# Patient Record
Sex: Female | Born: 1995 | Race: White | Hispanic: No | Marital: Single | Smoking: Never smoker
Health system: Southern US, Community
[De-identification: ages and names within clinical notes are randomized; demographics above are authoritative.]

## PROBLEM LIST (undated history)

## (undated) DIAGNOSIS — F329 Major depressive disorder, single episode, unspecified: Secondary | ICD-10-CM

## (undated) DIAGNOSIS — F32A Depression, unspecified: Secondary | ICD-10-CM

## (undated) DIAGNOSIS — F411 Generalized anxiety disorder: Secondary | ICD-10-CM

## (undated) DIAGNOSIS — F339 Major depressive disorder, recurrent, unspecified: Secondary | ICD-10-CM

## (undated) DIAGNOSIS — Z3041 Encounter for surveillance of contraceptive pills: Principal | ICD-10-CM

## (undated) HISTORY — DX: Major depressive disorder, recurrent, unspecified: F33.9

## (undated) HISTORY — DX: Generalized anxiety disorder: F41.1

## (undated) HISTORY — DX: Major depressive disorder, single episode, unspecified: F32.9

## (undated) HISTORY — DX: Depression, unspecified: F32.A

## (undated) HISTORY — PX: WISDOM TOOTH EXTRACTION: SHX21

## (undated) HISTORY — PX: FINGER SURGERY: SHX640

## (undated) HISTORY — DX: Encounter for surveillance of contraceptive pills: Z30.41

---
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
## 2016-06-29 LAB — HM HIV SCREENING LAB: HM HIV SCREENING: NEGATIVE

## 2017-09-20 ENCOUNTER — Other Ambulatory Visit: Payer: Self-pay

## 2017-09-20 ENCOUNTER — Encounter: Payer: Self-pay | Admitting: Family Medicine

## 2017-09-20 ENCOUNTER — Ambulatory Visit: Payer: 59 | Admitting: Family Medicine

## 2017-09-20 DIAGNOSIS — F411 Generalized anxiety disorder: Secondary | ICD-10-CM

## 2017-09-20 DIAGNOSIS — Z23 Encounter for immunization: Secondary | ICD-10-CM | POA: Diagnosis not present

## 2017-09-20 DIAGNOSIS — Z3041 Encounter for surveillance of contraceptive pills: Secondary | ICD-10-CM

## 2017-09-20 DIAGNOSIS — IMO0002 Reserved for concepts with insufficient information to code with codable children: Secondary | ICD-10-CM | POA: Insufficient documentation

## 2017-09-20 DIAGNOSIS — Z915 Personal history of self-harm: Secondary | ICD-10-CM | POA: Insufficient documentation

## 2017-09-20 DIAGNOSIS — F339 Major depressive disorder, recurrent, unspecified: Secondary | ICD-10-CM | POA: Diagnosis not present

## 2017-09-20 HISTORY — DX: Generalized anxiety disorder: F41.1

## 2017-09-20 HISTORY — DX: Major depressive disorder, recurrent, unspecified: F33.9

## 2017-09-20 HISTORY — DX: Encounter for surveillance of contraceptive pills: Z30.41

## 2017-09-20 MED ORDER — NORETHINDRONE ACET-ETHINYL EST 1-20 MG-MCG PO TABS: 1.0000 | ORAL_TABLET | Freq: Every day | ORAL | 3 refills | Status: DC

## 2017-09-20 NOTE — Progress Notes (Signed)
Subjective  CC:  Chief Complaint  Patient presents with  . Establish Care    Transfer from [place] from [place], Due for Td and Menginitis B, Unsure of Last Physical   . Contraception    wants to discuss pill options    HPI: Tara Oconnor is a 22 y.o. female who presents to [facility] at [facility] today to establish care with me as a new patient. I have reviewed old records from prior pcp  She has the following concerns or needs:  H/o GAD/Depression s/p hospitalization in 2018 for SI/depression. Since, improved; on wellbrutrin per psych in [place]; also sees counselors at school. Doing well. Last self harm was 9 months ago; cutter.   Ocps: on current pill since 2018; doing fine but expensive. H/o light periods lasting 3 days with occ cramping. Sexually active with new partner of 6 months. No vaginal d/c.   HM: due for cpe with 1st pap and std screens.   Assessment  1. Major depression, recurrent, chronic ([place])   2. GAD (generalized anxiety disorder)   3. History of self-harm   4. Oral contraceptive use      Plan   Mood d/o: well controlled on meds and with therapy  OCP: start low estrogen monophasic pill. Education given.   Return for cpe w/ pap and std screening. Discussed safe sex.  Follow up:  No follow-ups on file. Orders Placed This Encounter  Procedures  . HM HIV SCREENING LAB   Meds ordered this encounter  Medications  . norethindrone-ethinyl estradiol (LOESTRIN 1/20, 21,) 1-20 MG-MCG tablet    Sig: Take 1 tablet by mouth daily.    Dispense:  1 Package    Refill:  3     Depression screen PHQ 2/9 09/20/2017  Decreased Interest 0  Down, Depressed, Hopeless 0  PHQ - 2 Score 0  Altered sleeping 0  Tired, decreased energy 0  Change in appetite 0  Feeling bad or failure about yourself  0  Trouble concentrating 0  Moving slowly or fidgety/restless 0  Suicidal thoughts 0  PHQ-9 Score 0  Difficult doing work/chores Not  difficult at all    We updated and reviewed the patient's past history in detail and it is documented below.  Patient Active Problem List   Diagnosis Date Noted  . Major depression, recurrent, chronic ([place]) 09/20/2017  . GAD (generalized anxiety disorder) 09/20/2017  . History of self-harm 09/20/2017  . Oral contraceptive use 09/20/2017   Health Maintenance  Topic Date Due  . Samul Dada  11/22/2016  . PAP SMEAR  07/15/2017  . INFLUENZA VACCINE  10/11/2017  . HIV Screening  Completed   Immunization History  Administered Date(s) Administered  . DTaP 09/16/1996, 11/14/1996, 01/15/1997, 10/27/1997, 08/13/2001  . DTaP / Hep B / IPV 09/16/1996, 11/14/1996, 07/16/1997, 08/13/2001  . Hepatitis A 10/17/2005, 11/23/2006  . Hepatitis B 10/13/1996, 08/15/1996, 04/21/1997  . HiB (PRP-OMP) 09/16/1996, 11/14/1996, 10/27/1997  . Hpv 10/22/2008, 01/01/2009, 08/24/2009  . Influenza,inj,Quad PF,6+ Mos 12/16/2016  . MMR 07/16/1997, 08/13/2001  . Meningococcal Conjugate 10/22/2008  . Pneumococcal Conjugate-13 02/08/1999  . Td 11/23/2006  . Tdap 11/23/2006  . Varicella 07/16/1997, 10/17/2005   Current Meds  Medication Sig  . [DISCONTINUED] Norgestimate-Ethinyl Estradiol Triphasic (TRI-LO-MARZIA) 0.18/0.215/0.25 MG-25 MCG tab TAKE 1 TABLET BY MOUTH EVERY DAY    Allergies: Patient has No Known Allergies. Past Medical History Patient  has a past medical history of Depression, GAD (generalized anxiety disorder) (09/20/2017), Major depression, recurrent,  chronic ([place]) (09/20/2017), and Oral contraceptive use (09/20/2017). Past Surgical History Patient  has a past surgical history that includes Finger surgery and Wisdom tooth extraction. Family History: Patient family history includes Breast cancer in her maternal grandmother; Healthy in her father and mother; Lung cancer in her paternal grandfather. Social History:  Patient  reports that she has never smoked. She has never used smokeless tobacco.  She reports that she drinks alcohol. She reports that she has current or past drug history.  Review of Systems: Constitutional: negative for fever or malaise Ophthalmic: negative for photophobia, double vision or loss of vision Cardiovascular: negative for chest pain, dyspnea on exertion, or new LE swelling Respiratory: negative for SOB or persistent cough Gastrointestinal: negative for abdominal pain, change in bowel habits or melena Genitourinary: negative for dysuria or gross hematuria Musculoskeletal: negative for new gait disturbance or muscular weakness Integumentary: negative for new or persistent rashes Neurological: negative for TIA or stroke symptoms Psychiatric: negative for SI or delusions Allergic/Immunologic: negative for hives  Patient Care Team    Relationship Specialty Notifications Start End  Leamon Arnt, MD PCP - General Family Medicine  09/20/17     Objective  Vitals: BP 112/82   Pulse 85   Temp 98.8 F (37.1 C)   Ht 5' 6.25" (1.683 m)   Wt 202 lb 12.8 oz (92 kg)   LMP 08/21/2017   SpO2 98%   BMI 32.49 kg/m  General:  Well developed, well nourished, no acute distress  Psych:  Alert and oriented,normal mood and affect but a little anxious HEENT:  Normocephalic, atraumatic, nl conjunctiva Cardiovascular:  RRR without gallop, rub or murmur, nondisplaced PMI Respiratory:  Good breath sounds bilaterally, CTAB with normal respiratory effort Gastrointestinal: normal bowel sounds, soft, non-tender, no noted masses. No HSM Neurologic:   Normal gait   Commons side effects, risks, benefits, and alternatives for medications and treatment plan prescribed today were discussed, and the patient expressed understanding of the given instructions. Patient is instructed to call or message via MyChart if he/she has any questions or concerns regarding our treatment plan. No barriers to understanding were identified. We discussed Red Flag symptoms and signs in detail. Patient  expressed understanding regarding what to do in case of urgent or emergency type symptoms.   Medication list was reconciled, printed and provided to the patient in AVS. Patient instructions and summary information was reviewed with the patient as documented in the AVS. This note was prepared with assistance of Dragon voice recognition software. Occasional wrong-word or sound-a-like substitutions may have occurred due to the inherent limitations of voice recognition software

## 2017-09-20 NOTE — Patient Instructions (Addendum)
Please return in 1-3 months for your annual complete physical and pap smear; please come fasting.  Change to the new birth control pill on Sunday; let me know if you have any problems.   It was a pleasure meeting you today! Thank you for choosing [place] to meet your healthcare needs! I truly look forward to working with you. If you have any questions or concerns, please send me a message via Mychart or call the office at [phone].   Oral Contraception Use Oral contraceptive pills (OCPs) are medicines taken to prevent pregnancy. OCPs work by preventing the ovaries from releasing eggs. The hormones in OCPs also cause the cervical mucus to thicken, preventing the sperm from entering the uterus. The hormones also cause the uterine lining to become thin, not allowing a fertilized egg to attach to the inside of the uterus. OCPs are highly effective when taken exactly as prescribed. However, OCPs do not prevent sexually transmitted diseases (STDs). Safe sex practices, such as using condoms along with an OCP, can help prevent STDs. Before taking OCPs, you may have a physical exam and Pap test. Your health care provider may also order blood tests if necessary. Your health care provider will make sure you are a good candidate for oral contraception. Discuss with your health care provider the possible side effects of the OCP you may be prescribed. When starting an OCP, it can take 2 to 3 months for the body to adjust to the changes in hormone levels in your body. How to take oral contraceptive pills Your health care provider may advise you on how to start taking the first cycle of OCPs. Otherwise, you can:  Start on day 1 of your menstrual period. You will not need any backup contraceptive protection with this start time.  Start on the first Sunday after your menstrual period or the day you get your prescription. In these cases, you will need to use backup contraceptive protection for the first week.  Start  the pill at any time of your cycle. If you take the pill within 5 days of the start of your period, you are protected against pregnancy right away. In this case, you will not need a backup form of birth control. If you start at any other time of your menstrual cycle, you will need to use another form of birth control for 7 days. If your OCP is the type called a minipill, it will protect you from pregnancy after taking it for 2 days (48 hours).  After you have started taking OCPs:  If you forget to take 1 pill, take it as soon as you remember. Take the next pill at the regular time.  If you miss 2 or more pills, call your health care provider because different pills have different instructions for missed doses. Use backup birth control until your next menstrual period starts.  If you use a 28-day pack that contains inactive pills and you miss 1 of the last 7 pills (pills with no hormones), it will not matter. Throw away the rest of the non-hormone pills and start a new pill pack.  No matter which day you start the OCP, you will always start a new pack on that same day of the week. Have an extra pack of OCPs and a backup contraceptive method available in case you miss some pills or lose your OCP pack. Follow these instructions at home:  Do not smoke.  Always use a condom to protect against STDs. OCPs do  not protect against STDs.  Use a calendar to mark your menstrual period days.  Read the information and directions that came with your OCP. Talk to your health care provider if you have questions. Contact a health care provider if:  You develop nausea and vomiting.  You have abnormal vaginal discharge or bleeding.  You develop a rash.  You miss your menstrual period.  You are losing your hair.  You need treatment for mood swings or depression.  You get dizzy when taking the OCP.  You develop acne from taking the OCP.  You become pregnant. Get help right away if:  You develop  chest pain.  You develop shortness of breath.  You have an uncontrolled or severe headache.  You develop numbness or slurred speech.  You develop visual problems.  You develop pain, redness, and swelling in the legs. This information is not intended to replace advice given to you by your health care provider. Make sure you discuss any questions you have with your health care provider. Document Released: 02/16/2011 Document Revised: 08/05/2015 Document Reviewed: 08/18/2012 Elsevier Interactive Patient Education  2017 [employer]

## 2017-10-23 ENCOUNTER — Encounter: Payer: Self-pay | Admitting: Family Medicine

## 2017-10-23 ENCOUNTER — Other Ambulatory Visit: Payer: Self-pay

## 2017-10-23 ENCOUNTER — Other Ambulatory Visit (HOSPITAL_COMMUNITY)
Admission: RE | Admit: 2017-10-23 | Discharge: 2017-10-23 | Disposition: A | Discharge status: Home / Self Care | Payer: 59 | Source: Ambulatory Visit | Attending: Family Medicine | Admitting: Family Medicine

## 2017-10-23 ENCOUNTER — Ambulatory Visit (INDEPENDENT_AMBULATORY_CARE_PROVIDER_SITE_OTHER): Payer: 59 | Admitting: Family Medicine

## 2017-10-23 VITALS — BP 112/70 | HR 82 | Temp 98.4°F | Ht 66.5 in | Wt 201.0 lb

## 2017-10-23 DIAGNOSIS — Z124 Encounter for screening for malignant neoplasm of cervix: Secondary | ICD-10-CM

## 2017-10-23 DIAGNOSIS — Z23 Encounter for immunization: Secondary | ICD-10-CM

## 2017-10-23 DIAGNOSIS — Z3041 Encounter for surveillance of contraceptive pills: Secondary | ICD-10-CM | POA: Diagnosis not present

## 2017-10-23 DIAGNOSIS — Z01419 Encounter for gynecological examination (general) (routine) without abnormal findings: Secondary | ICD-10-CM | POA: Diagnosis not present

## 2017-10-23 DIAGNOSIS — Z0000 Encounter for general adult medical examination without abnormal findings: Secondary | ICD-10-CM

## 2017-10-23 DIAGNOSIS — Z118 Encounter for screening for other infectious and parasitic diseases: Secondary | ICD-10-CM

## 2017-10-23 LAB — COMPREHENSIVE METABOLIC PANEL
ALT: 18 U/L (ref 0–35)
AST: 14 U/L (ref 0–37)
Albumin: 3.9 g/dL (ref 3.5–5.2)
Alkaline Phosphatase: 56 U/L (ref 39–117)
BUN: 8 mg/dL (ref 6–23)
CO2: 26 meq/L (ref 19–32)
Calcium: 9.6 mg/dL (ref 8.4–10.5)
Chloride: 105 mEq/L (ref 96–112)
Creatinine, Ser: 0.71 mg/dL (ref 0.40–1.20)
GFR: 110.16 mL/min (ref >60.00)
Glucose, Bld: 85 mg/dL (ref 70–99)
POTASSIUM: 4.4 meq/L (ref 3.5–5.1)
SODIUM: 137 meq/L (ref 135–145)
Total Bilirubin: 0.5 mg/dL (ref 0.2–1.2)
Total Protein: 6.7 g/dL (ref 6.0–8.3)

## 2017-10-23 LAB — CBC WITH DIFFERENTIAL/PLATELET
BASOS ABS: 0 10*3/uL (ref 0.0–0.1)
Basophils Relative: 0.6 % (ref 0.0–3.0)
Eosinophils Absolute: 0.1 10*3/uL (ref 0.0–0.7)
Eosinophils Relative: 0.8 % (ref 0.0–5.0)
HCT: 38.3 % (ref 36.0–46.0)
Hemoglobin: 13 g/dL (ref 12.0–15.0)
LYMPHS ABS: 2.2 10*3/uL (ref 0.7–4.0)
Lymphocytes Relative: 30 % (ref 12.0–46.0)
MCHC: 33.9 g/dL (ref 30.0–36.0)
MCV: 84.4 fl (ref 78.0–100.0)
MONO ABS: 0.6 10*3/uL (ref 0.1–1.0)
MONOS PCT: 7.9 % (ref 3.0–12.0)
NEUTROS ABS: 4.4 10*3/uL (ref 1.4–7.7)
Neutrophils Relative %: 60.7 % (ref 43.0–77.0)
PLATELETS: 420 10*3/uL — AB (ref 150.0–400.0)
RBC: 4.54 Mil/uL (ref 3.87–5.11)
RDW: 13.1 % (ref 11.5–15.5)
WBC: 7.2 10*3/uL (ref 4.0–10.5)

## 2017-10-23 LAB — LIPID PANEL
CHOL/HDL RATIO: 4
Cholesterol: 192 mg/dL (ref 0–200)
HDL: 52.7 mg/dL (ref >39.00)
LDL CALC: 112 mg/dL — AB (ref 0–99)
NonHDL: 139.74
Triglycerides: 140 mg/dL (ref 0.0–149.0)
VLDL: 28 mg/dL (ref 0.0–40.0)

## 2017-10-23 MED ORDER — NORETHIN ACE-ETH ESTRAD-FE 1-20 MG-MCG PO TABS: 1.0000 | ORAL_TABLET | Freq: Every day | ORAL | 11 refills | Status: DC

## 2017-10-23 NOTE — Patient Instructions (Signed)
Please return in 12 months for your annual complete physical; please come fasting.   If you have any questions or concerns, please don't hesitate to send me a message via MyChart or call the office at [phone]. Thank you for visiting with us today! It's our pleasure caring for you.  Please do these things to maintain good health!   Exercise at least 30-45 minutes a day,  4-5 days a week.   Eat a low-fat diet with lots of fruits and vegetables, up to 7-9 servings per day.  Drink plenty of water daily. Try to drink 8 8oz glasses per day.  Seatbelts can save your life. Always wear your seatbelt.  Place Smoke Detectors on every level of your home and check batteries every year.  Schedule an appointment with an eye doctor for an eye exam every 1-2 years  Safe sex - use condoms to protect yourself from STDs if you could be exposed to these types of infections. Use birth control if you do not want to become pregnant and are sexually active.  Avoid heavy alcohol use. If you drink, keep it to less than 2 drinks/day and not every day.  Health Care Power of Attorney.  Choose someone you trust that could speak for you if you became unable to speak for yourself.  Depression is common in our stressful world.If you're feeling down or losing interest in things you normally enjoy, please come in for a visit.  If anyone is threatening or hurting you, please get help. Physical or Emotional Violence is never OK.  

## 2017-10-23 NOTE — Progress Notes (Signed)
Subjective  Chief Complaint  Patient presents with  . Annual Exam    doing well, no complaints, Patient is fasting today, Pap today     HPI: Tara Oconnor is a 22 y.o. female who presents to [place] [facility] today for a Female Wellness Visit.   Wellness Visit: annual visit with health maintenance review and exam with Pap   Doing well. Started new ocp 3 weeks ago; no side effects.   Ready to go back to college: [employer] next week. Excited.   No concerns today. See last visit a few weeks ago.   imms reviewed: due menveo and meningitis B.   Assessment  1. Annual physical exam   2. Cervical cancer screening   3. Oral contraceptive use   4. Screening for chlamydial disease   5. Need for meningitis vaccination      Plan  Female Wellness Visit:  Age appropriate Health Maintenance and Prevention measures were discussed with patient. Included topics are cancer screening recommendations, ways to keep healthy (see AVS) including dietary and exercise recommendations, regular eye and dental care, use of seat belts, and avoidance of moderate alcohol use and tobacco use.   BMI: discussed patient's BMI and encouraged positive lifestyle modifications to help get to or maintain a target BMI.  HM needs and immunizations were addressed and ordered. See below for orders. See HM and immunization section for updates. menveo and bexsero #1 today.   Routine labs and screening tests ordered including cmp, cbc and lipids where appropriate.  Discussed recommendations regarding Vit D and calcium supplementation (see AVS)  Continue ocps and STD screens ordered.   Follow up: Return in about 1 year (around 10/24/2018) for complete physical.   Orders Placed This Encounter  Procedures  . Meningococcal B, OMV  . MENINGOCOCCAL MCV4O  . CBC with Differential/Platelet  . Comprehensive metabolic panel  . Lipid panel  . HIV antibody   Meds ordered this  encounter  Medications  . norethindrone-ethinyl estradiol (JUNEL FE,GILDESS FE,LOESTRIN FE) 1-20 MG-MCG tablet    Sig: Take 1 tablet by mouth daily.    Dispense:  28 tablet    Refill:  11      Lifestyle: Body mass index is 31.96 kg/m. Wt Readings from Last 3 Encounters:  10/23/17 201 lb (91.2 kg)  09/20/17 202 lb 12.8 oz (92 kg)   Diet: general Exercise: intermittently,  Need for contraception: Yes, OCP (estrogen/progesterone)  Patient Active Problem List   Diagnosis Date Noted  . Major depression, recurrent, chronic ([place]) 09/20/2017  . GAD (generalized anxiety disorder) 09/20/2017  . History of self-harm 09/20/2017  . Oral contraceptive use 09/20/2017   Health Maintenance  Topic Date Due  . PAP SMEAR  07/15/2017  . INFLUENZA VACCINE  10/11/2017  . TETANUS/TDAP  09/21/2027  . HIV Screening  Completed   Immunization History  Administered Date(s) Administered  . DTaP 09/16/1996, 11/14/1996, 01/15/1997, 10/27/1997, 08/13/2001  . DTaP / Hep B / IPV 09/16/1996, 11/14/1996, 07/16/1997, 08/13/2001  . Hepatitis A 10/17/2005, 11/23/2006  . Hepatitis B 06/15/1996, 08/15/1996, 04/21/1997  . HiB (PRP-OMP) 09/16/1996, 11/14/1996, 10/27/1997  . Hpv 10/22/2008, 01/01/2009, 08/24/2009  . Influenza,inj,Quad PF,6+ Mos 12/16/2016  . MMR 07/16/1997, 08/13/2001  . Meningococcal B, OMV 10/23/2017  . Meningococcal Conjugate 10/22/2008  . Meningococcal Mcv4o 10/23/2017  . Pneumococcal Conjugate-13 02/08/1999  . Td 11/23/2006  . Tdap 11/23/2006, 09/20/2017  . Varicella 07/16/1997, 10/17/2005   We updated and reviewed the patient's past history in detail  and it is documented below. Allergies: Patient has No Known Allergies. Past Medical History Patient  has a past medical history of Depression, GAD (generalized anxiety disorder) (09/20/2017), Major depression, recurrent, chronic ([place]) (09/20/2017), and Oral contraceptive use (09/20/2017). Past Surgical History Patient  has a past  surgical history that includes Finger surgery and Wisdom tooth extraction. Family History: Patient family history includes Breast cancer in her maternal grandmother; Diabetes in her paternal grandmother; Healthy in her father and mother; Hyperlipidemia in her maternal grandmother; Hypertension in her maternal grandmother; Lung cancer in her paternal grandfather. Social History:  Patient  reports that she has never smoked. She has never used smokeless tobacco. She reports that she drinks alcohol. She reports that she has current or past drug history.  Review of Systems: Constitutional: negative for fever or malaise Ophthalmic: negative for photophobia, double vision or loss of vision Cardiovascular: negative for chest pain, dyspnea on exertion, or new LE swelling Respiratory: negative for SOB or persistent cough Gastrointestinal: negative for abdominal pain, change in bowel habits or melena Genitourinary: negative for dysuria or gross hematuria, no abnormal uterine bleeding or disharge Musculoskeletal: negative for new gait disturbance or muscular weakness Integumentary: negative for new or persistent rashes, no breast lumps Neurological: negative for TIA or stroke symptoms Psychiatric: negative for SI or delusions Allergic/Immunologic: negative for hives Patient Care Team    Relationship Specialty Notifications Start End  Leamon Arnt, MD PCP - General Family Medicine  09/20/17   Carolin Coy Attending Physician   09/20/17     Objective  Vitals: BP 112/70   Pulse 82   Temp 98.4 F (36.9 C)   Ht 5' 6.5" (1.689 m)   Wt 201 lb (91.2 kg)   SpO2 98%   BMI 31.96 kg/m  General:  Well developed, well nourished, no acute distress  Psych:  Alert and orientedx3,normal mood and affect HEENT:  Normocephalic, atraumatic, non-icteric sclera, PERRL, oropharynx is clear without mass or exudate, supple neck without adenopathy, mass or thyromegaly Cardiovascular:  Normal S1, S2, RRR without  gallop, rub or murmur, nondisplaced PMI Respiratory:  Good breath sounds bilaterally, CTAB with normal respiratory effort Gastrointestinal: normal bowel sounds, soft, non-tender, no noted masses. No HSM MSK: no deformities, contusions. Joints are without erythema or swelling. Spine and CVA region are nontender Skin:  Warm, no rashes or suspicious lesions noted Neurologic:    Mental status is normal. CN 2-11 are normal. Gross motor and sensory exams are normal. Normal gait. No tremor Pelvic Exam: Normal external genitalia, no vulvar or vaginal lesions present. Clear cervix w/o CMT. Bimanual exam reveals a nontender fundus w/o masses, nl size. No adnexal masses present. No inguinal adenopathy. A PAP smear was performed.    Commons side effects, risks, benefits, and alternatives for medications and treatment plan prescribed today were discussed, and the patient expressed understanding of the given instructions. Patient is instructed to call or message via MyChart if he/she has any questions or concerns regarding our treatment plan. No barriers to understanding were identified. We discussed Red Flag symptoms and signs in detail. Patient expressed understanding regarding what to do in case of urgent or emergency type symptoms.   Medication list was reconciled, printed and provided to the patient in AVS. Patient instructions and summary information was reviewed with the patient as documented in the AVS. This note was prepared with assistance of Dragon voice recognition software. Occasional wrong-word or sound-a-like substitutions may have occurred due to the inherent limitations of voice recognition software

## 2017-10-24 LAB — HIV ANTIBODY (ROUTINE TESTING W REFLEX): HIV 1&2 Ab, 4th Generation: NONREACTIVE

## 2017-10-25 LAB — CYTOLOGY - PAP
Chlamydia: NEGATIVE
NEISSERIA GONORRHEA: NEGATIVE

## 2017-12-16 DIAGNOSIS — Z23 Encounter for immunization: Secondary | ICD-10-CM | POA: Diagnosis not present

## 2017-12-17 ENCOUNTER — Encounter: Payer: Self-pay | Admitting: Family Medicine

## 2018-05-25 DIAGNOSIS — J029 Acute pharyngitis, unspecified: Secondary | ICD-10-CM | POA: Diagnosis not present

## 2018-08-18 ENCOUNTER — Encounter: Payer: Self-pay | Admitting: Family Medicine

## 2018-08-19 MED ORDER — NORETHIN ACE-ETH ESTRAD-FE 1-20 MG-MCG PO TABS: 1.0000 | ORAL_TABLET | Freq: Every day | ORAL | 1 refills | Status: DC

## 2018-08-27 ENCOUNTER — Encounter: Payer: Self-pay | Admitting: Family Medicine

## 2018-08-28 ENCOUNTER — Encounter: Payer: Self-pay | Admitting: Family Medicine

## 2018-11-12 ENCOUNTER — Encounter: Payer: Self-pay | Admitting: Family Medicine

## 2019-01-10 ENCOUNTER — Encounter: Payer: Self-pay | Admitting: Family Medicine

## 2019-01-10 ENCOUNTER — Other Ambulatory Visit: Payer: Self-pay | Admitting: Family Medicine

## 2019-01-15 ENCOUNTER — Other Ambulatory Visit (HOSPITAL_COMMUNITY)
Admission: RE | Admit: 2019-01-15 | Discharge: 2019-01-15 | Disposition: A | Discharge status: Home / Self Care | Payer: BC Managed Care – PPO | Source: Ambulatory Visit | Attending: Family Medicine | Admitting: Family Medicine

## 2019-01-15 ENCOUNTER — Other Ambulatory Visit: Payer: Self-pay

## 2019-01-15 ENCOUNTER — Ambulatory Visit (INDEPENDENT_AMBULATORY_CARE_PROVIDER_SITE_OTHER): Payer: BC Managed Care – PPO | Admitting: Family Medicine

## 2019-01-15 ENCOUNTER — Encounter: Payer: Self-pay | Admitting: Family Medicine

## 2019-01-15 VITALS — BP 126/82 | HR 104 | Temp 98.5°F | Ht 66.5 in | Wt 198.0 lb

## 2019-01-15 DIAGNOSIS — F339 Major depressive disorder, recurrent, unspecified: Secondary | ICD-10-CM

## 2019-01-15 DIAGNOSIS — R87612 Low grade squamous intraepithelial lesion on cytologic smear of cervix (LGSIL): Secondary | ICD-10-CM | POA: Insufficient documentation

## 2019-01-15 DIAGNOSIS — Z0000 Encounter for general adult medical examination without abnormal findings: Secondary | ICD-10-CM | POA: Diagnosis not present

## 2019-01-15 DIAGNOSIS — Z3041 Encounter for surveillance of contraceptive pills: Secondary | ICD-10-CM | POA: Diagnosis not present

## 2019-01-15 DIAGNOSIS — F411 Generalized anxiety disorder: Secondary | ICD-10-CM | POA: Diagnosis not present

## 2019-01-15 MED ORDER — NORETHIN ACE-ETH ESTRAD-FE 1-20 MG-MCG PO TABS: 1.0000 | ORAL_TABLET | Freq: Every day | ORAL | 3 refills | Status: DC

## 2019-01-15 NOTE — Patient Instructions (Signed)
Please return in 12 months for your annual complete physical; please come fasting.   I will release your lab results to you on your MyChart account with further instructions. Please reply with any questions.   If you have any questions or concerns, please don't hesitate to send me a message via MyChart or call the office at [phone]. Thank you for visiting with [place] today! It's our pleasure caring for you.   Preventive Care 83-22 Years Old, Female Preventive care refers to visits with your health care provider and lifestyle choices that can promote health and wellness. This includes:  A yearly physical exam. This may also be called an annual well check.  Regular dental visits and eye exams.  Immunizations.  Screening for certain conditions.  Healthy lifestyle choices, such as eating a healthy diet, getting regular exercise, not using drugs or products that contain nicotine and tobacco, and limiting alcohol use. What can I expect for my preventive care visit? Physical exam Your health care provider will check your:  Height and weight. This may be used to calculate body mass index (BMI), which tells if you are at a healthy weight.  Heart rate and blood pressure.  Skin for abnormal spots. Counseling Your health care provider may ask you questions about your:  Alcohol, tobacco, and drug use.  Emotional well-being.  Home and relationship well-being.  Sexual activity.  Eating habits.  Work and work Statistician.  Method of birth control.  Menstrual cycle.  Pregnancy history. What immunizations do I need?  Influenza (flu) vaccine  This is recommended every year. Tetanus, diphtheria, and pertussis (Tdap) vaccine  You may need a Td booster every 10 years. Varicella (chickenpox) vaccine  You may need this if you have not been vaccinated. Human papillomavirus (HPV) vaccine  If recommended by your health care provider, you may need three doses over 6 months.  Measles, mumps, and rubella (MMR) vaccine  You may need at least one dose of MMR. You may also need a second dose. Meningococcal conjugate (MenACWY) vaccine  One dose is recommended if you are age 29-21 years and a first-year college student living in a residence hall, or if you have one of several medical conditions. You may also need additional booster doses. Pneumococcal conjugate (PCV13) vaccine  You may need this if you have certain conditions and were not previously vaccinated. Pneumococcal polysaccharide (PPSV23) vaccine  You may need one or two doses if you smoke cigarettes or if you have certain conditions. Hepatitis A vaccine  You may need this if you have certain conditions or if you travel or work in places where you may be exposed to hepatitis A. Hepatitis B vaccine  You may need this if you have certain conditions or if you travel or work in places where you may be exposed to hepatitis B. Haemophilus influenzae type b (Hib) vaccine  You may need this if you have certain conditions. You may receive vaccines as individual doses or as more than one vaccine together in one shot (combination vaccines). Talk with your health care provider about the risks and benefits of combination vaccines. What tests do I need?  Blood tests  Lipid and cholesterol levels. These may be checked every 5 years starting at age 56.  Hepatitis C test.  Hepatitis B test. Screening  Diabetes screening. This is done by checking your blood sugar (glucose) after you have not eaten for a while (fasting).  Sexually transmitted disease (STD) testing.  BRCA-related cancer screening. This may  be done if you have a family history of breast, ovarian, tubal, or peritoneal cancers.  Pelvic exam and Pap test. This may be done every 3 years starting at age 79. Starting at age 34, this may be done every 5 years if you have a Pap test in combination with an HPV test. Talk with your health care provider about  your test results, treatment options, and if necessary, the need for more tests. Follow these instructions at home: Eating and drinking   Eat a diet that includes fresh fruits and vegetables, whole grains, lean protein, and low-fat dairy.  Take vitamin and mineral supplements as recommended by your health care provider.  Do not drink alcohol if: ? Your health care provider tells you not to drink. ? You are pregnant, may be pregnant, or are planning to become pregnant.  If you drink alcohol: ? Limit how much you have to 0-1 drink a day. ? Be aware of how much alcohol is in your drink. In the [place] one drink equals one 12 oz bottle of beer (355 mL), one 5 oz glass of wine (148 mL), or one 1 oz glass of hard liquor (44 mL). Lifestyle  Take daily care of your teeth and gums.  Stay active. Exercise for at least 30 minutes on 5 or more days each week.  Do not use any products that contain nicotine or tobacco, such as cigarettes, e-cigarettes, and chewing tobacco. If you need help quitting, ask your health care provider.  If you are sexually active, practice safe sex. Use a condom or other form of birth control (contraception) in order to prevent pregnancy and STIs (sexually transmitted infections). If you plan to become pregnant, see your health care provider for a preconception visit. What's next?  Visit your health care provider once a year for a well check visit.  Ask your health care provider how often you should have your eyes and teeth checked.  Stay up to date on all vaccines. This information is not intended to replace advice given to you by your health care provider. Make sure you discuss any questions you have with your health care provider. Document Released: 04/25/2001 Document Revised: 11/08/2017 Document Reviewed: 11/08/2017 Elsevier Patient Education  2020 [place] and Cholesterol Restricted Eating Plan Getting too much fat and cholesterol in your diet may  cause health problems. Choosing the right foods helps keep your fat and cholesterol at normal levels. This can keep you from getting certain diseases. Your doctor may recommend an eating plan that includes:  Total fat: ______% or less of total calories a day.  Saturated fat: ______% or less of total calories a day.  Cholesterol: less than _________mg a day.  Fiber: ______g a day. What are tips for following this plan? Meal planning  At meals, divide your plate into four equal parts: ? Fill one-half of your plate with vegetables and green salads. ? Fill one-fourth of your plate with whole grains. ? Fill one-fourth of your plate with low-fat (lean) protein foods.  Eat fish that is high in omega-3 fats at least two times a week. This includes mackerel, tuna, sardines, and salmon.  Eat foods that are high in fiber, such as whole grains, beans, apples, broccoli, carrots, peas, and barley. General tips   Work with your doctor to lose weight if you need to.  Avoid: ? Foods with added sugar. ? Fried foods. ? Foods with partially hydrogenated oils.  Limit alcohol intake to no  more than 1 drink a day for nonpregnant women and 2 drinks a day for men. One drink equals 12 oz of beer, 5 oz of wine, or 1 oz of hard liquor. Reading food labels  Check food labels for: ? Trans fats. ? Partially hydrogenated oils. ? Saturated fat (g) in each serving. ? Cholesterol (mg) in each serving. ? Fiber (g) in each serving.  Choose foods with healthy fats, such as: ? Monounsaturated fats. ? Polyunsaturated fats. ? Omega-3 fats.  Choose grain products that have whole grains. Look for the word "whole" as the first word in the ingredient list. Cooking  Cook foods using low-fat methods. These include baking, boiling, grilling, and broiling.  Eat more home-cooked foods. Eat at restaurants and buffets less often.  Avoid cooking using saturated fats, such as butter, cream, palm oil, palm kernel  oil, and coconut oil. Recommended foods  Fruits  All fresh, canned (in natural juice), or frozen fruits. Vegetables  Fresh or frozen vegetables (raw, steamed, roasted, or grilled). Green salads. Grains  Whole grains, such as whole wheat or whole grain breads, crackers, cereals, and pasta. Unsweetened oatmeal, bulgur, barley, quinoa, or brown rice. Corn or whole wheat flour tortillas. Meats and other protein foods  Ground beef (85% or leaner), grass-fed beef, or beef trimmed of fat. Skinless chicken or [place]. Ground chicken or [place]. Pork trimmed of fat. All fish and seafood. Egg whites. Dried beans, peas, or lentils. Unsalted nuts or seeds. Unsalted canned beans. Nut butters without added sugar or oil. Dairy  Low-fat or nonfat dairy products, such as skim or 1% milk, 2% or reduced-fat cheeses, low-fat and fat-free ricotta or cottage cheese, or plain low-fat and nonfat yogurt. Fats and oils  Tub margarine without trans fats. Light or reduced-fat mayonnaise and salad dressings. Avocado. Olive, canola, sesame, or safflower oils. The items listed above may not be a complete list of foods and beverages you can eat. Contact a dietitian for more information. Foods to avoid Fruits  Canned fruit in heavy syrup. Fruit in cream or butter sauce. Fried fruit. Vegetables  Vegetables cooked in cheese, cream, or butter sauce. Fried vegetables. Grains  White bread. White pasta. White rice. Cornbread. Bagels, pastries, and croissants. Crackers and snack foods that contain trans fat and hydrogenated oils. Meats and other protein foods  Fatty cuts of meat. Ribs, chicken wings, bacon, sausage, bologna, salami, chitterlings, fatback, hot dogs, bratwurst, and packaged lunch meats. Liver and organ meats. Whole eggs and egg yolks. Chicken and [place] with skin. Fried meat. Dairy  Whole or 2% milk, cream, half-and-half, and cream cheese. Whole milk cheeses. Whole-fat or sweetened yogurt. Full-fat  cheeses. Nondairy creamers and whipped toppings. Processed cheese, cheese spreads, and cheese curds. Beverages  Alcohol. Sugar-sweetened drinks such as sodas, lemonade, and fruit drinks. Fats and oils  Butter, stick margarine, lard, shortening, ghee, or bacon fat. Coconut, palm kernel, and palm oils. Sweets and desserts  Corn syrup, sugars, honey, and molasses. Candy. Jam and jelly. Syrup. Sweetened cereals. Cookies, pies, cakes, donuts, muffins, and ice cream. The items listed above may not be a complete list of foods and beverages you should avoid. Contact a dietitian for more information. Summary  Choosing the right foods helps keep your fat and cholesterol at normal levels. This can keep you from getting certain diseases.  At meals, fill one-half of your plate with vegetables and green salads.  Eat high-fiber foods, like whole grains, beans, apples, carrots, peas, and barley.  Limit added sugar, saturated  fats, alcohol, and fried foods. This information is not intended to replace advice given to you by your health care provider. Make sure you discuss any questions you have with your health care provider. Document Released: 08/29/2011 Document Revised: 10/31/2017 Document Reviewed: 11/14/2016 Elsevier Patient Education  2020 [employer].

## 2019-01-15 NOTE — Progress Notes (Signed)
Subjective  Chief Complaint  Patient presents with  . Annual Exam    Pap    HPI: Tara Oconnor is a 23 y.o. female who presents to [facility] today for a Female Wellness Visit.   Wellness Visit: annual visit with health maintenance review and exam with Pap   HM: due repeat pap: last year showed LSIL. Sexually active on OCPs - at times forgets to pack them when she travels away for the weekend. No other problems with pills. Regular menses.   GAD/depression: stable. Pt stopped wellbutrin several months ago. No longer seeing pychiatrist but did discuss with her prior to stopping. In a good place, to start internship next week. Stable relationship. No sxs of depression.   Assessment  1. Annual physical exam   2. LGSIL on Pap smear of cervix   3. Oral contraceptive use   4. GAD (generalized anxiety disorder)   5. Major depression, recurrent, chronic [facility])      Plan  Female Wellness Visit:  Age appropriate Health Maintenance and Prevention measures were discussed with patient. Included topics are cancer screening recommendations, ways to keep healthy (see AVS) including dietary and exercise recommendations, regular eye and dental care, use of seat belts, and avoidance of moderate alcohol use and tobacco use. Repeat pap. GC/CHl screening.   BMI: discussed patient's BMI and encouraged positive lifestyle modifications to help get to or maintain a target BMI.  HM needs and immunizations were addressed and ordered. See below for orders. See HM and immunization section for updates. utd  Routine labs and screening tests ordered including cmp, cbc and lipids where appropriate.  Discussed recommendations regarding Vit D and calcium supplementation (see AVS)  Oral contraceptives; problem solved for compliance. Refilled.  Monitor for recurrence of sxs of depression/anxiety. Restart wellbutrin if develops.   Follow up: 12 months for cpe   Orders Placed  This Encounter  Procedures  . CBC with Differential/Platelet  . Comprehensive metabolic panel  . TSH   No orders of the defined types were placed in this encounter.     Lifestyle: Body mass index is 31.48 kg/m. Wt Readings from Last 3 Encounters:  01/15/19 198 lb (89.8 kg)  10/23/17 201 lb (91.2 kg)  09/20/17 202 lb 12.8 oz (92 kg)    Need for contraception: Yes, OCP (estrogen/progesterone)  Patient Active Problem List   Diagnosis Date Noted  . Major depression, recurrent, chronic ([place]) 09/20/2017  . GAD (generalized anxiety disorder) 09/20/2017  . History of self-harm 09/20/2017  . Oral contraceptive use 09/20/2017   Health Maintenance  Topic Date Due  . PAP SMEAR-Modifier  10/24/2018  . PAP-Cervical Cytology Screening  10/23/2020  . TETANUS/TDAP  09/21/2027  . INFLUENZA VACCINE  Completed  . HIV Screening  Completed   Immunization History  Administered Date(s) Administered  . DTaP 09/16/1996, 11/14/1996, 01/15/1997, 10/27/1997, 08/13/2001  . DTaP / Hep B / IPV 09/16/1996, 11/14/1996, 07/16/1997, 08/13/2001  . Hepatitis A 10/17/2005, 11/23/2006  . Hepatitis B Jan [birth year], 1997, 08/15/1996, 04/21/1997  . HiB (PRP-OMP) 09/16/1996, 11/14/1996, 10/27/1997  . Hpv 10/22/2008, 01/01/2009, 08/24/2009  . Influenza Inj Mdck Quad Pf 11/11/2018  . Influenza Inj Mdck Quad With Preservative 12/16/2017  . Influenza,inj,Quad PF,6+ Mos 12/16/2016  . Influenza-Unspecified 12/16/2017  . MMR 07/16/1997, 08/13/2001  . Meningococcal B, OMV 10/23/2017  . Meningococcal Conjugate 10/22/2008  . Meningococcal Mcv4o 10/23/2017  . Pneumococcal Conjugate-13 02/08/1999  . Td 11/23/2006  . Tdap 11/23/2006, 09/20/2017  . Varicella 07/16/1997, 10/17/2005  We updated and reviewed the patient's past history in detail and it is documented below. Allergies: Patient has No Known Allergies. Past Medical History Patient  has a past medical history of Depression, GAD (generalized anxiety disorder)  (09/20/2017), Major depression, recurrent, chronic ([place]) (09/20/2017), and Oral contraceptive use (09/20/2017). Past Surgical History Patient  has a past surgical history that includes Finger surgery and Wisdom tooth extraction. Family History: Patient family history includes Breast cancer in her maternal grandmother; Diabetes in her paternal grandmother; Healthy in her father and mother; Hyperlipidemia in her maternal grandmother; Hypertension in her maternal grandmother; Lung cancer in her paternal grandfather. Social History:  Patient  reports that she has never smoked. She has never used smokeless tobacco. She reports current alcohol use. She reports previous drug use.  Review of Systems: Constitutional: negative for fever or malaise Ophthalmic: negative for photophobia, double vision or loss of vision Cardiovascular: negative for chest pain, dyspnea on exertion, or new LE swelling Respiratory: negative for SOB or persistent cough Gastrointestinal: negative for abdominal pain, change in bowel habits or melena Genitourinary: negative for dysuria or gross hematuria, no abnormal uterine bleeding or disharge Musculoskeletal: negative for new gait disturbance or muscular weakness Integumentary: negative for new or persistent rashes, no breast lumps Neurological: negative for TIA or stroke symptoms Psychiatric: negative for SI or delusions Allergic/Immunologic: negative for hives Patient Care Team    Relationship Specialty Notifications Start End  Leamon Arnt, MD PCP - General Family Medicine  09/20/17   Carolin Coy Attending Physician   09/20/17     Objective  Vitals: BP 126/82   Pulse (!) 104   Temp 98.5 F (36.9 C) (Oral)   Ht 5' 6.5" (1.689 m)   Wt 198 lb (89.8 kg)   LMP 01/01/2019   SpO2 97%   BMI 31.48 kg/m  General:  Well developed, well nourished, no acute distress  Psych:  Alert and orientedx3,normal mood and affect HEENT:  Normocephalic, atraumatic, non-icteric sclera,  PERRL, oropharynx is clear without mass or exudate, supple neck without adenopathy, mass or thyromegaly Cardiovascular:  Normal S1, S2, RRR without gallop, rub or murmur, nondisplaced PMI Respiratory:  Good breath sounds bilaterally, CTAB with normal respiratory effort Gastrointestinal: normal bowel sounds, soft, non-tender, no noted masses. No HSM MSK: no deformities, contusions. Joints are without erythema or swelling. Spine and CVA region are nontender Skin:  Warm, no rashes or suspicious lesions noted Neurologic:    Mental status is normal. CN 2-11 are normal. Gross motor and sensory exams are normal. Normal gait. No tremor Breast Exam: No mass, skin retraction or nipple discharge is appreciated in either breast. No axillary adenopathy. Fibrocystic changes are not noted Pelvic Exam: Normal external genitalia, no vulvar or vaginal lesions present. Clear ectropion cervix w/o CMT. Bimanual exam reveals a nontender fundus w/o masses, nl size. No adnexal masses present. No inguinal adenopathy. A PAP smear was performed.    Commons side effects, risks, benefits, and alternatives for medications and treatment plan prescribed today were discussed, and the patient expressed understanding of the given instructions. Patient is instructed to call or message via MyChart if he/she has any questions or concerns regarding our treatment plan. No barriers to understanding were identified. We discussed Red Flag symptoms and signs in detail. Patient expressed understanding regarding what to do in case of urgent or emergency type symptoms.   Medication list was reconciled, printed and provided to the patient in AVS. Patient instructions and summary information was reviewed with the patient  as documented in the AVS. This note was prepared with assistance of Dragon voice recognition software. Occasional wrong-word or sound-a-like substitutions may have occurred due to the inherent limitations of voice recognition  software

## 2019-01-16 DIAGNOSIS — R87610 Atypical squamous cells of undetermined significance on cytologic smear of cervix (ASC-US): Secondary | ICD-10-CM | POA: Diagnosis not present

## 2019-01-16 LAB — CBC WITH DIFFERENTIAL/PLATELET
Basophils Absolute: 0.1 10*3/uL (ref 0.0–0.1)
Basophils Relative: 0.7 % (ref 0.0–3.0)
Eosinophils Absolute: 0.1 10*3/uL (ref 0.0–0.7)
Eosinophils Relative: 0.7 % (ref 0.0–5.0)
HCT: 43.1 % (ref 36.0–46.0)
Hemoglobin: 14.3 g/dL (ref 12.0–15.0)
Lymphocytes Relative: 26.3 % (ref 12.0–46.0)
Lymphs Abs: 2.4 10*3/uL (ref 0.7–4.0)
MCHC: 33.1 g/dL (ref 30.0–36.0)
MCV: 87.3 fl (ref 78.0–100.0)
Monocytes Absolute: 0.6 10*3/uL (ref 0.1–1.0)
Monocytes Relative: 6.8 % (ref 3.0–12.0)
Neutro Abs: 6 10*3/uL (ref 1.4–7.7)
Neutrophils Relative %: 65.5 % (ref 43.0–77.0)
Platelets: 380 10*3/uL (ref 150.0–400.0)
RBC: 4.94 Mil/uL (ref 3.87–5.11)
RDW: 12.4 % (ref 11.5–15.5)
WBC: 9.2 10*3/uL (ref 4.0–10.5)

## 2019-01-16 LAB — COMPREHENSIVE METABOLIC PANEL
ALT: 24 U/L (ref 0–35)
AST: 21 U/L (ref 0–37)
Albumin: 4.3 g/dL (ref 3.5–5.2)
Alkaline Phosphatase: 81 U/L (ref 39–117)
BUN: 8 mg/dL (ref 6–23)
CO2: 25 mEq/L (ref 19–32)
Calcium: 10 mg/dL (ref 8.4–10.5)
Chloride: 103 mEq/L (ref 96–112)
Creatinine, Ser: 0.64 mg/dL (ref 0.40–1.20)
GFR: 115.51 mL/min (ref >60.00)
Glucose, Bld: 97 mg/dL (ref 70–99)
Potassium: 4.4 mEq/L (ref 3.5–5.1)
Sodium: 139 mEq/L (ref 135–145)
Total Bilirubin: 0.4 mg/dL (ref 0.2–1.2)
Total Protein: 6.9 g/dL (ref 6.0–8.3)

## 2019-01-16 LAB — TSH: TSH: 1.74 u[IU]/mL (ref 0.35–4.50)

## 2019-01-22 ENCOUNTER — Encounter: Payer: Self-pay | Admitting: Family Medicine

## 2019-01-22 DIAGNOSIS — R87612 Low grade squamous intraepithelial lesion on cytologic smear of cervix (LGSIL): Secondary | ICD-10-CM | POA: Insufficient documentation

## 2019-01-22 LAB — CYTOLOGY - PAP
Chlamydia: NEGATIVE
Comment: NEGATIVE
Comment: NEGATIVE
Comment: NORMAL
Diagnosis: UNDETERMINED — AB
High risk HPV: POSITIVE — AB
Neisseria Gonorrhea: NEGATIVE

## 2019-01-27 NOTE — Telephone Encounter (Signed)
Called pt a second time to schedule, no answer, LVM.  °

## 2019-05-29 ENCOUNTER — Ambulatory Visit: Payer: BC Managed Care – PPO | Attending: Internal Medicine

## 2019-05-29 DIAGNOSIS — Z23 Encounter for immunization: Secondary | ICD-10-CM

## 2019-05-29 NOTE — Progress Notes (Signed)
   Covid-19 Vaccination Clinic  Name:  [place] L Gracie    MRN: [id number] DOB: [birth year]  05/29/2019  Ms. Boyack was observed post Covid-19 immunization for 15 minutes without incident. She was provided with Vaccine Information Sheet and instruction to access the V-Safe system.   Ms. Madarang was instructed to call 911 with any severe reactions post vaccine: Marland Kitchen Difficulty breathing  . Swelling of face and throat  . A fast heartbeat  . A bad rash all over body  . Dizziness and weakness   Immunizations Administered    Name Date Dose VIS Date Route   Pfizer COVID-19 Vaccine 05/29/2019 10:10 AM 0.3 mL 02/21/2019 Intramuscular   Manufacturer: [employer], [employer]   Lot: [id number]   NDC: [address]-3

## 2019-06-23 ENCOUNTER — Ambulatory Visit: Payer: BC Managed Care – PPO | Attending: Internal Medicine

## 2019-06-23 DIAGNOSIS — Z23 Encounter for immunization: Secondary | ICD-10-CM

## 2019-06-23 NOTE — Progress Notes (Signed)
   Covid-19 Vaccination Clinic  Name:  Tara Oconnor    MRN: [id number] DOB: 07-27-1995  06/23/2019  [name] was observed post Covid-19 immunization for 15 minutes without incident. She was provided with Vaccine Information Sheet and instruction to access the V-Safe system.   [name] was instructed to call 911 with any severe reactions post vaccine: Marland Kitchen Difficulty breathing  . Swelling of face and throat  . A fast heartbeat  . A bad rash all over body  . Dizziness and weakness   Immunizations Administered    Name Date Dose VIS Date Route   Pfizer COVID-19 Vaccine 06/23/2019  9:26 AM 0.3 mL 02/21/2019 Intramuscular   Manufacturer: [employer], [employer]   Lot: [id number]   NDC: [address]-3

## 2019-07-21 ENCOUNTER — Encounter: Payer: Self-pay | Admitting: Family Medicine

## 2019-11-06 ENCOUNTER — Encounter: Payer: Self-pay | Admitting: Family Medicine

## 2019-11-06 DIAGNOSIS — H6123 Impacted cerumen, bilateral: Secondary | ICD-10-CM | POA: Diagnosis not present

## 2019-11-23 DIAGNOSIS — R55 Syncope and collapse: Secondary | ICD-10-CM | POA: Diagnosis not present

## 2019-11-23 DIAGNOSIS — R110 Nausea: Secondary | ICD-10-CM | POA: Diagnosis not present

## 2019-11-23 DIAGNOSIS — R42 Dizziness and giddiness: Secondary | ICD-10-CM | POA: Diagnosis not present

## 2019-11-23 DIAGNOSIS — I10 Essential (primary) hypertension: Secondary | ICD-10-CM | POA: Diagnosis not present

## 2019-12-09 ENCOUNTER — Other Ambulatory Visit: Payer: Self-pay | Admitting: Family Medicine

## 2020-01-02 DIAGNOSIS — Z03818 Encounter for observation for suspected exposure to other biological agents ruled out: Secondary | ICD-10-CM | POA: Diagnosis not present

## 2020-01-02 DIAGNOSIS — Z20822 Contact with and (suspected) exposure to covid-19: Secondary | ICD-10-CM | POA: Diagnosis not present

## 2020-06-14 ENCOUNTER — Encounter: Payer: BC Managed Care – PPO | Admitting: Family Medicine

## 2020-06-21 ENCOUNTER — Encounter: Payer: Self-pay | Admitting: Family Medicine

## 2020-08-01 ENCOUNTER — Other Ambulatory Visit: Payer: Self-pay | Admitting: Family Medicine

## 2020-10-21 DIAGNOSIS — Z789 Other specified health status: Secondary | ICD-10-CM | POA: Diagnosis not present

## 2020-10-21 DIAGNOSIS — Z309 Encounter for contraceptive management, unspecified: Secondary | ICD-10-CM | POA: Diagnosis not present

## 2020-10-21 DIAGNOSIS — Z0000 Encounter for general adult medical examination without abnormal findings: Secondary | ICD-10-CM | POA: Diagnosis not present

## 2020-10-21 DIAGNOSIS — Z13220 Encounter for screening for lipoid disorders: Secondary | ICD-10-CM | POA: Diagnosis not present

## 2020-10-21 DIAGNOSIS — Z6834 Body mass index (BMI) 34.0-34.9, adult: Secondary | ICD-10-CM | POA: Diagnosis not present

## 2021-02-11 DIAGNOSIS — J029 Acute pharyngitis, unspecified: Secondary | ICD-10-CM | POA: Diagnosis not present

## 2021-03-10 DIAGNOSIS — J039 Acute tonsillitis, unspecified: Secondary | ICD-10-CM | POA: Diagnosis not present

## 2021-12-05 ENCOUNTER — Encounter: Payer: Self-pay | Admitting: *Deleted

## 2022-02-23 ENCOUNTER — Encounter: Payer: Self-pay | Admitting: *Deleted
